# Patient Record
Sex: Female | Born: 1935 | Race: Black or African American | Hispanic: No | Marital: Single | Smoking: Former smoker
Health system: Southern US, Community
[De-identification: ages and names within clinical notes are randomized; demographics above are authoritative.]

## PROBLEM LIST (undated history)

## (undated) DIAGNOSIS — M810 Age-related osteoporosis without current pathological fracture: Secondary | ICD-10-CM

## (undated) DIAGNOSIS — N186 End stage renal disease: Secondary | ICD-10-CM

## (undated) DIAGNOSIS — I10 Essential (primary) hypertension: Secondary | ICD-10-CM

## (undated) DIAGNOSIS — I4891 Unspecified atrial fibrillation: Secondary | ICD-10-CM

---

## 2016-04-17 ENCOUNTER — Other Ambulatory Visit (HOSPITAL_COMMUNITY): Payer: Self-pay

## 2016-04-17 ENCOUNTER — Ambulatory Visit (HOSPITAL_COMMUNITY)
Admission: AD | Admit: 2016-04-17 | Discharge: 2016-04-17 | Disposition: A | Discharge status: Home / Self Care | Payer: Medicare Other | Source: Other Acute Inpatient Hospital | Attending: Internal Medicine | Admitting: Internal Medicine

## 2016-04-17 ENCOUNTER — Inpatient Hospital Stay
Admission: AD | Admit: 2016-04-17 | Discharge: 2016-05-05 | Disposition: E | Payer: Self-pay | Source: Ambulatory Visit | Attending: Internal Medicine | Admitting: Internal Medicine

## 2016-04-17 DIAGNOSIS — J9600 Acute respiratory failure, unspecified whether with hypoxia or hypercapnia: Secondary | ICD-10-CM

## 2016-04-17 DIAGNOSIS — J9690 Respiratory failure, unspecified, unspecified whether with hypoxia or hypercapnia: Secondary | ICD-10-CM

## 2016-04-17 DIAGNOSIS — J9811 Atelectasis: Secondary | ICD-10-CM

## 2016-04-17 DIAGNOSIS — J189 Pneumonia, unspecified organism: Secondary | ICD-10-CM

## 2016-04-17 DIAGNOSIS — Z0189 Encounter for other specified special examinations: Secondary | ICD-10-CM

## 2016-04-17 HISTORY — DX: Age-related osteoporosis without current pathological fracture: M81.0

## 2016-04-17 HISTORY — DX: Unspecified atrial fibrillation: I48.91

## 2016-04-17 HISTORY — DX: Essential (primary) hypertension: I10

## 2016-04-17 HISTORY — DX: End stage renal disease: N18.6

## 2016-04-17 NOTE — Consult Note (Signed)
[facility] CONSULT NOTE    Date: 04/10/2016                  Patient Name:  Natasha Spencer  MRN: [id number]  DOB: [birth year]  Age / Sex: 81 y.o., female         PCP: Pcp Not In System                 Service Requesting Consult: [facility]                 Reason for Consult: Acute renal failure/CKD stage IV            History of Present Illness: Patient is a 81 y.o. female with a PMHx of chronic kidney disease stage IV, hypertension, gout, hyperlipidemia, who was admitted [facility] 04/14/2016 for ongoing medical treatment of small bowel obstruction status post surgical repair. The patient originally [facility] with intractable nausea and vomiting as well as abdominal pain. Initial imaging showed small bowel obstruction as above. She had 4 days of NG tube suction. Despite this her symptoms do not improve. Therefore she went for her expiratory laparotomy and is status post repair. Unfortunately her renal function deteriorated and she was started on hemodialysis. She has a right internal jugular PermCath placed. Previously she had a peritoneal analysis catheter which was never used in preparation for renal replacement therapy. The peritoneal analysis catheter was removed at the time of surgery and apparently was encapsulated. Postoperatively the patient had significant complications. She developed septic shock. She was started on broad-spectrum antibiotics. She also developed SVT requiring electrical cardioversion. She subsequently developed atrial fibrillation as well and she continues to be in atrial fibrillation. Later she had a CT scan of the chest abdomen and pelvis which demonstrated a dilated gallbladder. She was felt to have acalculous cholecystitis and a gallbladder drain was placed. She also had a right-sided thoracentesis performed. Infectious disease also saw the patient.    Medications: Current  medications: Protonix 40 mg daily, Zosyn 2.25 g IV every 8 hours, TPN, lisinopril 20 mg daily, Lipitor 20 mg each bedtime, amlodipine 5 mg daily, allopurinol 100 mg daily, albuterol inhaler 3 mL inhaled every 4 hours, metoprolol 5 mg every 4 hours, metoclopramide 5 mg IV every 8 hours    Allergies: No known drug allergies   Past Medical History: chronic kidney disease stage IV, hypertension, gout, hyperlipidemia,  Past Surgical History: Status post exploratory laparotomy for small bowel obstruction Gallbladder drain placement Right internal jugular PermCath  Family History: Unable to obtain from the patient as she is currently on the ventilator.  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 She has no children. She lived independently prior to becoming ill.   Review of Systems: Unable to obtain from the patient as she is currently on the ventilator.  Vital Signs: Temperature 97.1 pulse 126 respirations 9 blood pressure 134/66 Weight trends: There were no vitals filed for this visit.  Physical Exam: General: Critically ill-appearing  Head: Normocephalic, atraumatic.  Eyes: Mild icterus, extraocular movements intact, bilateral arcus senilis  Nose: Mucous membranes moist, not inflammed, nonerythematous.  Throat: Endotracheal tube in place   Neck: Supple, trachea midline.  Lungs:  Bilateral rhonchi, vent assisted  Heart: S1-S2, irregular, atrial  Fibrillation on the monitor  Abdomen:  Midline abdominal scar, gallbladder drain noted. Scant bowel sounds.  Extremities: No pretibial edema.  Neurologic: Arousable, does nod PS/no to questions  Skin: No visible rashes, scars.    Lab results: Basic Metabolic Panel: No results for input(s): NA, K, CL, CO2, GLUCOSE, BUN, CREATININE, CALCIUM, MG, PHOS in the last 168 hours.  Liver Function Tests: No results for input(s): AST, ALT, ALKPHOS, BILITOT,  PROT, ALBUMIN in the last 168 hours. No results for input(s): LIPASE, AMYLASE in the last 168 hours. No results for input(s): AMMONIA in the last 168 hours.  CBC: No results for input(s): WBC, NEUTROABS, HGB, HCT, MCV, PLT in the last 168 hours.  Cardiac Enzymes: No results for input(s): CKTOTAL, CKMB, CKMBINDEX, TROPONINI in the last 168 hours.  BNP: Invalid input(s): POCBNP  CBG: No results for input(s): GLUCAP in the last 168 hours.  Microbiology: No results found for this or any previous visit.  Coagulation Studies: No results for input(s): LABPROT, INR in the last 72 hours.  Urinalysis: No results for input(s): COLORURINE, LABSPEC, PHURINE, GLUCOSEU, HGBUR, BILIRUBINUR, KETONESUR, PROTEINUR, UROBILINOGEN, NITRITE, LEUKOCYTESUR in the last 72 hours.  Invalid input(s): APPERANCEUR    Imaging:  No results found.   Assessment & Plan: Pt is a 81 y.o. female with a PMHx of chronic kidney disease stage IV, hypertension, gout, hyperlipidemia, who was admitted [facility] on 04-[facility]-16 for ongoing medical treatment of small bowel obstruction status post surgical repair. The patient originally presented to the [facility] with intractable nausea and vomiting as well as abdominal pain. Initial imaging showed small bowel obstruction as above. She had 4 days of NG tube suction. Despite this her symptoms do not improve. Therefore she went for her expiratory laparotomy and is status post repair. Unfortunately her renal function deteriorated and she was started on hemodialysis. She has a right internal jugular PermCath placed. Previously she had a peritoneal analysis catheter which was never used in preparation for renal replacement therapy.  1. Acute renal failure/chronic kidney disease stage IV. We will plan to resume the patient on hemodialysis while she is here at our institution. We will plan for hemodialysis tomorrow. Ultrafiltration target will be set at  1.5 kg. Previously the patient had a peritoneal dialysis catheter in place however this was removed at the time of her recent surgery.  2. Anemia of chronic kidney disease/blood loss. Apparently the patient required  Multiple blood products during her recent hospitalization. We will followup CBC and continue to monitor her hemoglobin. We will also consider use of Aranesp depending upon.  3.  Respiratory failure:  Continue ventilatory support. Weaning protocols will be put into place by pulmonary/medical care.  4. Sepsis. The patient is currently maintained on Zosyn for this issue. This will be continued under the instruction of the hospitalist.

## 2016-04-18 ENCOUNTER — Encounter: Payer: Self-pay | Admitting: Adult Health

## 2016-04-18 ENCOUNTER — Other Ambulatory Visit (HOSPITAL_COMMUNITY): Payer: Self-pay

## 2016-04-18 DIAGNOSIS — N179 Acute kidney failure, unspecified: Secondary | ICD-10-CM

## 2016-04-18 DIAGNOSIS — J9601 Acute respiratory failure with hypoxia: Secondary | ICD-10-CM

## 2016-04-18 DIAGNOSIS — G9340 Encephalopathy, unspecified: Secondary | ICD-10-CM

## 2016-04-18 DIAGNOSIS — E8770 Fluid overload, unspecified: Secondary | ICD-10-CM

## 2016-04-18 LAB — PROTIME-INR
INR: 1.16
INR: 1.26
PROTHROMBIN TIME: 15.9 s — AB (ref 11.4–15.2)
Prothrombin Time: 14.9 seconds (ref 11.4–15.2)

## 2016-04-18 LAB — COMPREHENSIVE METABOLIC PANEL
ALBUMIN: 2.2 g/dL — AB (ref 3.5–5.0)
ALK PHOS: 89 U/L (ref 38–126)
ALT: 115 U/L — AB (ref 14–54)
ANION GAP: 12 (ref 5–15)
AST: 122 U/L — AB (ref 15–41)
BILIRUBIN TOTAL: 17.8 mg/dL — AB (ref 0.3–1.2)
BUN: 40 mg/dL — AB (ref 6–20)
CALCIUM: 8.1 mg/dL — AB (ref 8.9–10.3)
CO2: 22 mmol/L (ref 22–32)
CREATININE: 2.83 mg/dL — AB (ref 0.44–1.00)
Chloride: 102 mmol/L (ref 101–111)
GFR calc Af Amer: 17 mL/min — ABNORMAL LOW (ref >60)
GFR calc non Af Amer: 15 mL/min — ABNORMAL LOW (ref >60)
GLUCOSE: 118 mg/dL — AB (ref 65–99)
Potassium: 4.4 mmol/L (ref 3.5–5.1)
Sodium: 136 mmol/L (ref 135–145)
TOTAL PROTEIN: 5.2 g/dL — AB (ref 6.5–8.1)

## 2016-04-18 LAB — CBC
HCT: 27.4 % — ABNORMAL LOW (ref 36.0–46.0)
HEMATOCRIT: 27.6 % — AB (ref 36.0–46.0)
HEMOGLOBIN: 9.1 g/dL — AB (ref 12.0–15.0)
Hemoglobin: 9 g/dL — ABNORMAL LOW (ref 12.0–15.0)
MCH: 28.6 pg (ref 26.0–34.0)
MCH: 28.8 pg (ref 26.0–34.0)
MCHC: 32.8 g/dL (ref 30.0–36.0)
MCHC: 33 g/dL (ref 30.0–36.0)
MCV: 87 fL (ref 78.0–100.0)
MCV: 87.3 fL (ref 78.0–100.0)
PLATELETS: 68 10*3/uL — AB (ref 150–400)
Platelets: 71 10*3/uL — ABNORMAL LOW (ref 150–400)
RBC: 3.15 MIL/uL — AB (ref 3.87–5.11)
RBC: 3.16 MIL/uL — ABNORMAL LOW (ref 3.87–5.11)
RDW: 20.3 % — AB (ref 11.5–15.5)
RDW: 19.8 % — AB (ref 11.5–15.5)
WBC: 12.7 10*3/uL — ABNORMAL HIGH (ref 4.0–10.5)
WBC: 11.6 10*3/uL — ABNORMAL HIGH (ref 4.0–10.5)

## 2016-04-18 LAB — RENAL FUNCTION PANEL
ANION GAP: 9 (ref 5–15)
Albumin: 1.9 g/dL — ABNORMAL LOW (ref 3.5–5.0)
BUN: 23 mg/dL — AB (ref 6–20)
CHLORIDE: 100 mmol/L — AB (ref 101–111)
CO2: 24 mmol/L (ref 22–32)
Calcium: 7.6 mg/dL — ABNORMAL LOW (ref 8.9–10.3)
Creatinine, Ser: 1.72 mg/dL — ABNORMAL HIGH (ref 0.44–1.00)
GFR calc Af Amer: 31 mL/min — ABNORMAL LOW (ref >60)
GFR calc non Af Amer: 27 mL/min — ABNORMAL LOW (ref >60)
GLUCOSE: 141 mg/dL — AB (ref 65–99)
POTASSIUM: 3.8 mmol/L (ref 3.5–5.1)
Phosphorus: 3.9 mg/dL (ref 2.5–4.6)
Sodium: 133 mmol/L — ABNORMAL LOW (ref 135–145)

## 2016-04-18 LAB — TSH: TSH: 6.461 u[IU]/mL — ABNORMAL HIGH (ref 0.350–4.500)

## 2016-04-18 NOTE — Consult Note (Signed)
Name: Natasha Spencer MRN: [id number] DOB: 1935-07-13    ADMISSION DATE:  05/02/2016 CONSULTATION DATE:  9/14  REFERRING MD :  Sharyon MedicusHijazi   CHIEF COMPLAINT:  Vent management, mucous plugging/collapse   BRIEF PATIENT DESCRIPTION:  81yo female with hx ESRD, HTN initially admitted to [facility] with intractable nausea and vomiting.  Ultimately found to have SBO which failed conservative therapy and underwent exploratory lap which was c/b leakage of abdominal contents into peritoneal cavity.  Post op course was c/b AFib with RVR, septic shock, respiratory failure.  She failed SBT and was ultimately tx [facility] for ongoing [facility] wean.  PCCM consulted for vent management and assistance with mucous plugging/RLL collapse.   SIGNIFICANT EVENTS    STUDIES:     HISTORY OF PRESENT ILLNESS:  81yo female with hx ESRD, HTN initially admitted [facility] with intractable nausea and vomiting.  Ultimately found to have SBO which failed conservative therapy and underwent exploratory lap which was c/b leakage of abdominal contents into peritoneal cavity.  Post op course was c/b AFib with RVR, septic shock, respiratory failure.  She failed SBT and was ultimately [facility] for [facility] wean.  PCCM consulted for vent management and assistance with mucous plugging/RLL collapse.   PAST MEDICAL HISTORY :   has a past medical history of A-fib (HCC); ESRD (end stage renal disease) (HCC); HTN (hypertension); and Osteoporosis.  has no past surgical history on file. Prior to Admission medications   Not on File   No Known Allergies  FAMILY HISTORY:  family history is not on file. SOCIAL HISTORY:  reports that she has quit smoking. She has never used smokeless tobacco.  REVIEW OF SYSTEMS:   As per HPI - All other systems reviewed and were neg.    SUBJECTIVE:   VITAL SIGNS: HR 113 RR 33 BP 118/68 Sats 96% on 40% FiO2   PHYSICAL  EXAMINATION: General:  Frail, chronically ill appearing female, NAD on vent  Neuro:  Awake, alert, nods appropriately, follows commands  HEENT:  Mm moist, ETT  Cardiovascular:  s1s2 rrr, R chest perm cath  Lungs:  resps even non labored on vent, mild tachypnea, course L, diminished R base  Abdomen:  Round, soft, mildly tender diffusely, hypoactive BS  Musculoskeletal:  Warm and dry, scant BLE edema    Recent Labs Lab 04/18/16 0824  NA 136  K 4.4  CL 102  CO2 22  BUN 40*  CREATININE 2.83*  GLUCOSE 118*    Recent Labs Lab 04/18/16 1330  HGB 9.0*  HCT 27.4*  WBC 12.7*  PLT PENDING   Dg Chest Port 1 View  Result Date: 04/18/2016 CLINICAL DATA:  Ventilator support.  Respiratory failure. EXAM: PORTABLE CHEST 1 VIEW COMPARISON:  Earlier same day FINDINGS: Endotracheal tube tip is 4 cm above the carina. Nasogastric tube enters the abdomen. Central line is unchanged. Right lower lobe and middle lobe collapse persists. Partial collapse of the left lower lobe, improved. Upper lungs show interstitial edema which appears worsened. IMPRESSION: Worsening of interstitial edema pattern in the upper lungs. Right lower lobe and middle lobe collapse persists. Better aeration in the left lower lobe. Electronically Signed   By: Paulina FusiMark  Shogry M.D.   On: 04/18/2016 07:55   Dg Chest Port 1 View  Result Date: 04/18/2016 CLINICAL DATA:  Respiratory failure EXAM: PORTABLE CHEST 1 VIEW COMPARISON:  None. FINDINGS: Endotracheal tube tip is 4.6 cm above the carina. Nasogastric tube extends into  the stomach. Dual-lumen right jugular central line extends into the cavoatrial junction. No pneumothorax. There is consolidation in both bases.  Probable pleural effusions. IMPRESSION: Satisfactorily positioned support equipment. Consolidation in both lung bases. Probable effusions. Electronically Signed   By: Ellery Plunk M.D.   On: 04/18/2016 01:44   Dg Abd Portable 1v  Result Date: 04/22/2016 CLINICAL DATA:   NG tube placement. EXAM: PORTABLE ABDOMEN - 1 VIEW COMPARISON:  None. FINDINGS: An enteric tube terminates in the midline of the upper abdomen likely in the distal stomach with side hole well beyond the GE junction. No dilated loops of bowel are seen in the imaged portion of the abdomen. Postoperative changes are noted with midline abdominal skin staples present. Atherosclerotic vascular calcifications are present. A coarse 1.5 cm calcification in the right pelvis may represent a fibroid. Coarse calcification is incompletely visualized over the cardiac shadow in the medial left lung base. IMPRESSION: 1. Enteric tube in the distal stomach. 2. Aortic atherosclerosis. Electronically Signed   By: Sebastian Ache M.D.   On: 04/25/2016 19:48    ASSESSMENT / PLAN:  Acute respiratory failure -- r/t septic shock in setting peritonitis r/t SBO now c/b mucous plugging and RLL collapse.  Mucous plugging/Atelectasis/RLL collapse  ESRD  Peritonitis   PLAN -  -Vent support - 8cc/kg  -F/u CXR  -F/u ABG -Daily SBT  -Agree with aggressive pulmonary hygiene -- mucomyst, chest vest x 24 hrs  -Reassess CXR in am  -Consider FOB if no improvement  -DNR noted  -Poor candidate for trach especially with underlying ESRD  -Would attempt to optimize her current respiratory status with aggressive pulmonary hygiene +/- FOB and volume removal with HD, then can assess for extubation but would recommend no reintubation  -Continue nutritional support with TPN per pharmacy   Dirk Dress, NP 04/18/2016  2:29 PM Pager: [phone] or [phone]  Attending Note:  81 year old female with extensive PMH who presents to [facility] intubated and on dialysis after SBO admission and was taken to the OR with subsequent leak of abdominal contents in the peritoneal cavity and subsequently was sent to [facility] for weaning after failing an SBT.  On exam, diffuse crackles noted with decreased BS on the right.  I reviewed the CXR myself,  evidence of RLL atelectasis noted.  Will maintain on full support for now.  Start mucomyst and vibravest.  Repeat CXR in AM.  Insure patient is at dry weight.  If not resolved in AM then will consider bronch in AM.  Need to discuss with family that once ready for extubation that patient is DNR post extubation.  No trach at this time nor should it be offered given her physical condition and what would be her quality of life then.  PCCM will follow.  The patient is critically ill with multiple organ systems failure and requires high complexity decision making for assessment and support, frequent evaluation and titration of therapies, application of advanced monitoring technologies and extensive interpretation of multiple databases.   Critical Care Time devoted to patient care services described in this note is  35  Minutes. This time reflects time of care of this signee Dr Koren Bound. This critical care time does not reflect procedure time, or teaching time or supervisory time of PA/NP/Med student/Med Resident etc but could involve care discussion time.  Alyson Reedy, M.D. [facility] Pulmonary/Critical Care Medicine. Pager: [phone]. After hours pager: [phone].

## 2016-04-19 ENCOUNTER — Other Ambulatory Visit (HOSPITAL_COMMUNITY): Payer: Self-pay

## 2016-04-19 LAB — RENAL FUNCTION PANEL
ANION GAP: 7 (ref 5–15)
Albumin: 1.7 g/dL — ABNORMAL LOW (ref 3.5–5.0)
BUN: 23 mg/dL — ABNORMAL HIGH (ref 6–20)
CHLORIDE: 100 mmol/L — AB (ref 101–111)
CO2: 27 mmol/L (ref 22–32)
CREATININE: 1.6 mg/dL — AB (ref 0.44–1.00)
Calcium: 7.9 mg/dL — ABNORMAL LOW (ref 8.9–10.3)
GFR, EST AFRICAN AMERICAN: 34 mL/min — AB (ref >60)
GFR, EST NON AFRICAN AMERICAN: 30 mL/min — AB (ref >60)
Glucose, Bld: 130 mg/dL — ABNORMAL HIGH (ref 65–99)
Phosphorus: 3.6 mg/dL (ref 2.5–4.6)
Potassium: 3.9 mmol/L (ref 3.5–5.1)
Sodium: 134 mmol/L — ABNORMAL LOW (ref 135–145)

## 2016-04-19 LAB — CBC
HCT: 28.6 % — ABNORMAL LOW (ref 36.0–46.0)
Hemoglobin: 9.5 g/dL — ABNORMAL LOW (ref 12.0–15.0)
MCH: 29 pg (ref 26.0–34.0)
MCHC: 33.2 g/dL (ref 30.0–36.0)
MCV: 87.2 fL (ref 78.0–100.0)
PLATELETS: 74 10*3/uL — AB (ref 150–400)
RBC: 3.28 MIL/uL — AB (ref 3.87–5.11)
RDW: 20.2 % — ABNORMAL HIGH (ref 11.5–15.5)
WBC: 10.4 10*3/uL (ref 4.0–10.5)

## 2016-04-19 NOTE — Progress Notes (Signed)
[facility]  ROUNDING NOTE   Subjective:  Patient remains critically ill left this point in time. She remains on the ventilator. Discuss with respiratory therapy. Currently she is unable to be weaned from the ventilator secondary to tachypnea. Hospitalist hs requested daily dialysis to aid in ventilator weaning.   Objective:  Vital signs in last 24 hours:    Physical Exam: General: Critically ill-appearing  Head: NG tube in place, ETT in place  Eyes: Mild icterus  Neck: Supple, trachea midline  Lungs:  Bilateral rhonchi, normal effort  Heart: S1S2 irregular  Abdomen:  Midline abdominal scar, gallbladder drain  Extremities: 1+ peripheral edema.  Neurologic: Arousable, will follow simple commands  Skin: No lesions  Access: Right internal jugular PermCath in place    Basic Metabolic Panel:  Recent Labs Lab 04/18/16 0824 04/18/16 2014  NA 136 133*  K 4.4 3.8  CL 102 100*  CO2 22 24  GLUCOSE 118* 141*  BUN 40* 23*  CREATININE 2.83* 1.72*  CALCIUM 8.1* 7.6*  PHOS  --  3.9    Liver Function Tests:  Recent Labs Lab 04/18/16 0824 04/18/16 2014  AST 122*  --   ALT 115*  --   ALKPHOS 89  --   BILITOT 17.8*  --   PROT 5.2*  --   ALBUMIN 2.2* 1.9*   No results for input(s): LIPASE, AMYLASE in the last 168 hours. No results for input(s): AMMONIA in the last 168 hours.  CBC:  Recent Labs Lab 04/18/16 1330 04/18/16 2048  WBC 12.7* 11.6*  HGB 9.0* 9.1*  HCT 27.4* 27.6*  MCV 87.0 87.3  PLT 68* 71*    Cardiac Enzymes: No results for input(s): CKTOTAL, CKMB, CKMBINDEX, TROPONINI in the last 168 hours.  BNP: Invalid input(s): POCBNP  CBG: No results for input(s): GLUCAP in the last 168 hours.  Microbiology: No results found for this or any previous visit.  Coagulation Studies:  Recent Labs  04/18/16 0824 04/18/16 1038  LABPROT 14.9 15.9*  INR 1.16 1.26    Urinalysis: No results for input(s): COLORURINE, LABSPEC, PHURINE,  GLUCOSEU, HGBUR, BILIRUBINUR, KETONESUR, PROTEINUR, UROBILINOGEN, NITRITE, LEUKOCYTESUR in the last 72 hours.  Invalid input(s): APPERANCEUR    Imaging: Dg Chest Port 1 View  Result Date: 04/18/2016 CLINICAL DATA:  Ventilator support.  Respiratory failure. EXAM: PORTABLE CHEST 1 VIEW COMPARISON:  Earlier same day FINDINGS: Endotracheal tube tip is 4 cm above the carina. Nasogastric tube enters the abdomen. Central line is unchanged. Right lower lobe and middle lobe collapse persists. Partial collapse of the left lower lobe, improved. Upper lungs show interstitial edema which appears worsened. IMPRESSION: Worsening of interstitial edema pattern in the upper lungs. Right lower lobe and middle lobe collapse persists. Better aeration in the left lower lobe. Electronically Signed   By: Paulina Fusi M.D.   On: 04/18/2016 07:55   Dg Chest Port 1 View  Result Date: 04/18/2016 CLINICAL DATA:  Respiratory failure EXAM: PORTABLE CHEST 1 VIEW COMPARISON:  None. FINDINGS: Endotracheal tube tip is 4.6 cm above the carina. Nasogastric tube extends into the stomach. Dual-lumen right jugular central line extends into the cavoatrial junction. No pneumothorax. There is consolidation in both bases.  Probable pleural effusions. IMPRESSION: Satisfactorily positioned support equipment. Consolidation in both lung bases. Probable effusions. Electronically Signed   By: Ellery Plunk M.D.   On: 04/18/2016 01:44   Dg Abd Portable 1v  Result Date: 04/18/2016 CLINICAL DATA:  NG tube placement. EXAM: PORTABLE ABDOMEN - 1 VIEW  COMPARISON:  None. FINDINGS: An enteric tube terminates in the midline of the upper abdomen likely in the distal stomach with side hole well beyond the GE junction. No dilated loops of bowel are seen in the imaged portion of the abdomen. Postoperative changes are noted with midline abdominal skin staples present. Atherosclerotic vascular calcifications are present. A coarse 1.5 cm calcification in the  right pelvis may represent a fibroid. Coarse calcification is incompletely visualized over the cardiac shadow in the medial left lung base. IMPRESSION: 1. Enteric tube in the distal stomach. 2. Aortic atherosclerosis. Electronically Signed   By: Sebastian AcheAllen  Grady M.D.   On: April 21, 2016 19:48     Medications:       Assessment/ Plan:  81 y.o. female with a PMHx of chronic kidney disease stage IV, hypertension, gout, hyperlipidemia, who was admitted [facility] on 2016/06[facility]11 for ongoing medical treatment of small bowel obstruction status post surgical repair. The patient [facility] with intractable nausea and vomiting as well as abdominal pain. Initial imaging showed small bowel obstruction as above. She had 4 days of NG tube suction. Despite this her symptoms do not improve. Therefore she went for her expiratory laparotomy and is status post repair. Unfortunately her renal function deteriorated and she was started on hemodialysis. She has a right internal jugular PermCath placed. Previously she had a peritoneal analysis catheter which was never used in preparation for renal replacement therapy.  1. Acute renal failure/chronic kidney disease stage IV. Acute renal failure secondary to sepsis with existing chronic kidney disease stage IV. -Hospitals as requested that we perform daily dialysis for now. We will plan for dialysis today as well as tomorrow and then on Monday.  2. Anemia of chronic kidney disease/blood loss.  Hemoglobin currently 9.1. We will consider use of Aranesp next week.  3.  acute respiratory failure:  Patient is tachypneic per respiratory therapy. There's not been much suctioned at the moment.   LOS: 0 Natasha Spencer 9/15/20178:59 AM

## 2016-04-20 LAB — RENAL FUNCTION PANEL
ALBUMIN: 1.6 g/dL — AB (ref 3.5–5.0)
Anion gap: 12 (ref 5–15)
BUN: 31 mg/dL — ABNORMAL HIGH (ref 6–20)
CALCIUM: 7.8 mg/dL — AB (ref 8.9–10.3)
CO2: 25 mmol/L (ref 22–32)
CREATININE: 2.09 mg/dL — AB (ref 0.44–1.00)
Chloride: 97 mmol/L — ABNORMAL LOW (ref 101–111)
GFR, EST AFRICAN AMERICAN: 25 mL/min — AB (ref >60)
GFR, EST NON AFRICAN AMERICAN: 21 mL/min — AB (ref >60)
Glucose, Bld: 123 mg/dL — ABNORMAL HIGH (ref 65–99)
PHOSPHORUS: 4.1 mg/dL (ref 2.5–4.6)
Potassium: 3.8 mmol/L (ref 3.5–5.1)
SODIUM: 134 mmol/L — AB (ref 135–145)

## 2016-04-20 LAB — PTH, INTACT AND CALCIUM
Calcium, Total (PTH): 7.4 mg/dL — ABNORMAL LOW (ref 8.7–10.3)
PTH: 146 pg/mL — ABNORMAL HIGH (ref 15–65)

## 2016-04-20 LAB — CBC
HCT: 28.3 % — ABNORMAL LOW (ref 36.0–46.0)
Hemoglobin: 9.4 g/dL — ABNORMAL LOW (ref 12.0–15.0)
MCH: 29.5 pg (ref 26.0–34.0)
MCHC: 33.2 g/dL (ref 30.0–36.0)
MCV: 88.7 fL (ref 78.0–100.0)
PLATELETS: 86 10*3/uL — AB (ref 150–400)
RBC: 3.19 MIL/uL — AB (ref 3.87–5.11)
RDW: 20.7 % — ABNORMAL HIGH (ref 11.5–15.5)
WBC: 13.5 10*3/uL — AB (ref 4.0–10.5)

## 2016-04-20 LAB — HEPATITIS B CORE ANTIBODY, TOTAL: HEP B C TOTAL AB: NEGATIVE

## 2016-04-20 LAB — HEPATITIS B SURFACE ANTIGEN: Hepatitis B Surface Ag: NEGATIVE

## 2016-04-20 LAB — HEPATITIS B SURFACE ANTIBODY,QUALITATIVE: HEP B S AB: NONREACTIVE

## 2016-04-21 LAB — CBC
HCT: 29.9 % — ABNORMAL LOW (ref 36.0–46.0)
Hemoglobin: 9.8 g/dL — ABNORMAL LOW (ref 12.0–15.0)
MCH: 28.8 pg (ref 26.0–34.0)
MCHC: 32.8 g/dL (ref 30.0–36.0)
MCV: 87.9 fL (ref 78.0–100.0)
PLATELETS: 106 10*3/uL — AB (ref 150–400)
RBC: 3.4 MIL/uL — AB (ref 3.87–5.11)
RDW: 20.5 % — AB (ref 11.5–15.5)
WBC: 14.1 10*3/uL — AB (ref 4.0–10.5)

## 2016-04-21 LAB — BASIC METABOLIC PANEL
ANION GAP: 7 (ref 5–15)
BUN: 28 mg/dL — AB (ref 6–20)
CO2: 26 mmol/L (ref 22–32)
Calcium: 7.6 mg/dL — ABNORMAL LOW (ref 8.9–10.3)
Chloride: 98 mmol/L — ABNORMAL LOW (ref 101–111)
Creatinine, Ser: 1.82 mg/dL — ABNORMAL HIGH (ref 0.44–1.00)
GFR calc Af Amer: 29 mL/min — ABNORMAL LOW (ref >60)
GFR, EST NON AFRICAN AMERICAN: 25 mL/min — AB (ref >60)
GLUCOSE: 122 mg/dL — AB (ref 65–99)
POTASSIUM: 5.6 mmol/L — AB (ref 3.5–5.1)
SODIUM: 131 mmol/L — AB (ref 135–145)

## 2016-04-22 ENCOUNTER — Other Ambulatory Visit (HOSPITAL_COMMUNITY): Payer: Self-pay

## 2016-04-22 DIAGNOSIS — J9600 Acute respiratory failure, unspecified whether with hypoxia or hypercapnia: Secondary | ICD-10-CM

## 2016-04-22 DIAGNOSIS — Z0189 Encounter for other specified special examinations: Secondary | ICD-10-CM

## 2016-04-22 LAB — CBC
HCT: 27.2 % — ABNORMAL LOW (ref 36.0–46.0)
HEMOGLOBIN: 9 g/dL — AB (ref 12.0–15.0)
MCH: 29.4 pg (ref 26.0–34.0)
MCHC: 33.1 g/dL (ref 30.0–36.0)
MCV: 88.9 fL (ref 78.0–100.0)
PLATELETS: 116 10*3/uL — AB (ref 150–400)
RBC: 3.06 MIL/uL — AB (ref 3.87–5.11)
RDW: 21 % — ABNORMAL HIGH (ref 11.5–15.5)
WBC: 27.6 10*3/uL — ABNORMAL HIGH (ref 4.0–10.5)

## 2016-04-22 LAB — RENAL FUNCTION PANEL
ALBUMIN: 1.3 g/dL — AB (ref 3.5–5.0)
Anion gap: 11 (ref 5–15)
BUN: 40 mg/dL — ABNORMAL HIGH (ref 6–20)
CO2: 24 mmol/L (ref 22–32)
CREATININE: 2.67 mg/dL — AB (ref 0.44–1.00)
Calcium: 7.7 mg/dL — ABNORMAL LOW (ref 8.9–10.3)
Chloride: 99 mmol/L — ABNORMAL LOW (ref 101–111)
GFR calc non Af Amer: 16 mL/min — ABNORMAL LOW (ref >60)
GFR, EST AFRICAN AMERICAN: 18 mL/min — AB (ref >60)
GLUCOSE: 164 mg/dL — AB (ref 65–99)
PHOSPHORUS: 5.1 mg/dL — AB (ref 2.5–4.6)
Potassium: 4.6 mmol/L (ref 3.5–5.1)
SODIUM: 134 mmol/L — AB (ref 135–145)

## 2016-04-22 NOTE — Progress Notes (Signed)
Name: Natasha Spencer MRN: [id number] DOB: 05-Jul-1935    ADMISSION DATE:  05/02/2016 CONSULTATION DATE:  9/14  REFERRING MD :  Sharyon Medicus   CHIEF COMPLAINT:  Vent management, mucous plugging/collapse   BRIEF PATIENT DESCRIPTION:  81yo female with hx ESRD, HTN initially admitted to [facility] with intractable nausea and vomiting.  Ultimately found to have SBO which failed conservative therapy and underwent exploratory lap which was c/b leakage of abdominal contents into peritoneal cavity.  Post op course was c/b AFib with RVR, septic shock, respiratory failure.  She failed SBT and was [facility].  PCCM consulted for vent management and assistance with mucous plugging/RLL collapse.   SIGNIFICANT EVENTS    STUDIES:    SUBJECTIVE:   Currently on HD - not pulling fluid r/t soft BP per HD RN. Less awake.   VITAL SIGNS: HR 93 RR 20 BP 102/49 Sats 93% on 40% FiO2   PHYSICAL EXAMINATION: General:  Frail, chronically ill appearing female, uncomfortable appearing, looks much worse than last week  Neuro:  Less alert today, opens eyes and nods but requires considerable stimulation. Significant deconditioning.  HEENT:  Mm moist, ETT  Cardiovascular:  s1s2 rrr, R chest perm cath  Lungs:  resps even non labored on vent, course L, diminished R base  Abdomen:  Round, soft, mildly tender diffusely, hypoactive BS  Musculoskeletal:  Warm and dry, scant BLE edema    Recent Labs Lab 04/20/16 0623 04/21/16 1004 04/22/16 0500  NA 134* 131* 134*  K 3.8 5.6* 4.6  CL 97* 98* 99*  CO2 25 26 24   BUN 31* 28* 40*  CREATININE 2.09* 1.82* 2.67*  GLUCOSE 123* 122* 164*    Recent Labs Lab 04/20/16 0623 04/21/16 1004 04/22/16 0500  HGB 9.4* 9.8* 9.0*  HCT 28.3* 29.9* 27.2*  WBC 13.5* 14.1* 27.6*  PLT 86* 106* 116*   No results found.  ASSESSMENT / PLAN:  Acute respiratory failure -- r/t septic shock in setting peritonitis r/t SBO  now c/b mucous plugging and RLL collapse.  Mucous plugging/Atelectasis/RLL collapse  ESRD  Peritonitis   PLAN -  - f/u CXR now  -Vent support - 8cc/kg  -F/u ABG -Daily SBT  -continue aggressive pulmonary hygiene -- mucomyst, chest vest x 24 hrs  -Reassess CXR in am  -Consider FOB if no improvement   -DNR noted  -Poor candidate for trach especially with underlying ESRD - would not offer  -Continue nutritional support with TPN per pharmacy   Clinically looks worse overall despite aggressive volume removal and aggressive pulmonary hygiene including mucomyst and vibravest.  Will check CXR now and could potentially consider FOB if indicated.  Otherwise, need to continue goals of care discussions with family and consider transition to comfort.  VERY poor candidate for trach.    Dirk Dress, NP 04/22/2016  10:20 AM Pager: [phone] or [phone]  STAFF NOTE: I, Rory Percy, MD FACP have personally reviewed patient's available data, including medical history, events of note, physical examination and test results as part of my evaluation. I have discussed with resident/NP and other care providers such as pharmacist, RN and RRT. In addition, I personally evaluated patient and elicited key findings of: debilitated, weak, coarse BS but has BS rt base, pcxr now with improved RT base aeration, would dc mucomyst as we have maxed it use at day 4, keep chest pt, take peep to 8 to improve aeration and chances to not collapse, NO  SBT , will collapse immediately off pos pressure settings, no bronch indicated with improvements and futility, likely will need comfort care, I updated brother and istster and brother in law in room The patient is critically ill with multiple organ systems failure and requires high complexity decision making for assessment and support, frequent evaluation and titration of therapies, application of advanced monitoring technologies and extensive interpretation of  multiple databases.   Critical Care Time devoted to patient care services described in this note is 30 Minutes. This time reflects time of care of this signee: Rory Percyaniel Tiant Peixoto, MD FACP. This critical care time does not reflect procedure time, or teaching time or supervisory time of PA/NP/Med student/Med Resident etc but could involve care discussion time. Rest per NP/medical resident whose note is outlined above and that I agree with   Mcarthur Rossettianiel J. Tyson AliasFeinstein, MD, FACP Pgr: [phone] [facility] 04/22/2016 1:23 PM

## 2016-04-22 NOTE — Progress Notes (Signed)
[facility]  ROUNDING NOTE   Subjective:  Patient completed hemodialysis today. Still on the ventilator at this point in time with FiO2 40%. Significant edema persists.   Objective:  Vital signs in last 24 hours:    Physical Exam: General: Critically ill-appearing  Head: NG tube in place, ETT in place  Eyes: Mild icterus  Neck: Supple, trachea midline  Lungs:  Bilateral rhonchi, normal effort  Heart: S1S2 irregular  Abdomen:  Midline abdominal scar, gallbladder drain  Extremities: 2+ peripheral edema.  Neurologic: Arousable, will follow simple commands  Skin: No lesions  Access: Right internal jugular PermCath in place    Basic Metabolic Panel:  Recent Labs Lab 04/18/16 2014  04/19/16 1400 04/20/16 0623 04/21/16 1004 04/22/16 0500  NA 133*  --  134* 134* 131* 134*  K 3.8  --  3.9 3.8 5.6* 4.6  CL 100*  --  100* 97* 98* 99*  CO2 24  --  27 25 26 24   GLUCOSE 141*  --  130* 123* 122* 164*  BUN 23*  --  23* 31* 28* 40*  CREATININE 1.72*  --  1.60* 2.09* 1.82* 2.67*  CALCIUM 7.6*  < > 7.9* 7.8* 7.6* 7.7*  PHOS 3.9  --  3.6 4.1  --  5.1*  < > = values in this interval not displayed.  Liver Function Tests:  Recent Labs Lab 04/18/16 0824 04/18/16 2014 04/19/16 1400 04/20/16 0623 04/22/16 0500  AST 122*  --   --   --   --   ALT 115*  --   --   --   --   ALKPHOS 89  --   --   --   --   BILITOT 17.8*  --   --   --   --   PROT 5.2*  --   --   --   --   ALBUMIN 2.2* 1.9* 1.7* 1.6* 1.3*   No results for input(s): LIPASE, AMYLASE in the last 168 hours. No results for input(s): AMMONIA in the last 168 hours.  CBC:  Recent Labs Lab 04/18/16 2048 04/19/16 1400 04/20/16 0623 04/21/16 1004 04/22/16 0500  WBC 11.6* 10.4 13.5* 14.1* 27.6*  HGB 9.1* 9.5* 9.4* 9.8* 9.0*  HCT 27.6* 28.6* 28.3* 29.9* 27.2*  MCV 87.3 87.2 88.7 87.9 88.9  PLT 71* 74* 86* 106* 116*    Cardiac Enzymes: No results for input(s): CKTOTAL, CKMB, CKMBINDEX, TROPONINI in  the last 168 hours.  BNP: Invalid input(s): POCBNP  CBG: No results for input(s): GLUCAP in the last 168 hours.  Microbiology: No results found for this or any previous visit.  Coagulation Studies: No results for input(s): LABPROT, INR in the last 72 hours.  Urinalysis: No results for input(s): COLORURINE, LABSPEC, PHURINE, GLUCOSEU, HGBUR, BILIRUBINUR, KETONESUR, PROTEINUR, UROBILINOGEN, NITRITE, LEUKOCYTESUR in the last 72 hours.  Invalid input(s): APPERANCEUR    Imaging: Dg Chest Port 1 View  Result Date: 04/22/2016 CLINICAL DATA:  81 year old female status post surgery for small bowel obstruction. Postop septic shock and respiratory failure. Mucous plugging, right lower lobe collapse. Initial encounter. EXAM: PORTABLE CHEST 1 VIEW COMPARISON:  04/19/2016, 04/18/2016. FINDINGS: Portable AP semi upright view at at 1036 hours. Stable endotracheal tube tip position between the clavicles and carina. Enteric tube looped in the left upper quadrant at the level the stomach. Right chest dual lumen dialysis type catheter. Calcified aortic atherosclerosis. Stable cardiac size and mediastinal contours. Improved right lung base ventilation with improved visualization of the right  hemidiaphragm. Residual streaky right perihilar opacity. Probable small bilateral pleural effusions. No pneumothorax or pulmonary edema. IMPRESSION: 1.  Stable lines and tubes. 2. Improved right lung base ventilation with residual right perihilar streaky opacity which may reflect atelectasis. Small bilateral pleural effusions suspected. 3. No new cardiopulmonary abnormality. Electronically Signed   By: Odessa Fleming M.D.   On: 04/22/2016 10:51     Medications:       Assessment/ Plan:  81 y.o. female with a PMHx of chronic kidney disease stage IV, hypertension, gout, hyperlipidemia, who was admitted to [facility] on 29-Apr-2016 for ongoing medical treatment of small bowel obstruction status post surgical repair. The  patient originally presented to the [facility] with intractable nausea and vomiting as well as abdominal pain. Initial imaging showed small bowel obstruction as above. She had 4 days of NG tube suction. Despite this her symptoms do not improve. Therefore she went for her expiratory laparotomy and is status post repair. Unfortunately her renal function deteriorated and she was started on hemodialysis. She has a right internal jugular PermCath placed. Previously she had a peritoneal analysis catheter which was never used in preparation for renal replacement therapy.  1. Acute renal failure/chronic kidney disease stage IV. Acute renal failure secondary to sepsis with existing chronic kidney disease stage IV. -continue to perform daily dialysis for now for volume removal in an effort to help wean the patient from the ventilator.  Volume removal has been difficult given her blood pressure. Continue to use albumin with dialysis.  2. Anemia of chronic kidney disease/blood loss.  Hemoglobin currently 9.0. We will initiate the patient on therapy with Aranesp.  3.  acute respiratory failure:  Patient has been difficult to wean from the ventilator. We will continue ultrafiltration efforts to help her wean from the ventilator.  LOS: 0 Armand Preast 9/18/20174:19 PM

## 2016-04-23 ENCOUNTER — Other Ambulatory Visit (HOSPITAL_COMMUNITY): Payer: Self-pay

## 2016-04-23 LAB — CBC
HCT: 29.6 % — ABNORMAL LOW (ref 36.0–46.0)
Hemoglobin: 9.7 g/dL — ABNORMAL LOW (ref 12.0–15.0)
MCH: 29.2 pg (ref 26.0–34.0)
MCHC: 32.8 g/dL (ref 30.0–36.0)
MCV: 89.2 fL (ref 78.0–100.0)
PLATELETS: DECREASED 10*3/uL (ref 150–400)
RBC: 3.32 MIL/uL — ABNORMAL LOW (ref 3.87–5.11)
RDW: 21.2 % — AB (ref 11.5–15.5)
WBC: 30.1 10*3/uL — ABNORMAL HIGH (ref 4.0–10.5)

## 2016-04-23 LAB — PTH, INTACT AND CALCIUM
Calcium, Total (PTH): 7.1 mg/dL — ABNORMAL LOW (ref 8.7–10.3)
PTH: 204 pg/mL — AB (ref 15–65)

## 2016-04-23 LAB — RENAL FUNCTION PANEL
Albumin: 1.4 g/dL — ABNORMAL LOW (ref 3.5–5.0)
Anion gap: 13 (ref 5–15)
BUN: 42 mg/dL — AB (ref 6–20)
CHLORIDE: 99 mmol/L — AB (ref 101–111)
CO2: 20 mmol/L — AB (ref 22–32)
CREATININE: 2.59 mg/dL — AB (ref 0.44–1.00)
Calcium: 7.8 mg/dL — ABNORMAL LOW (ref 8.9–10.3)
GFR calc Af Amer: 19 mL/min — ABNORMAL LOW (ref >60)
GFR calc non Af Amer: 17 mL/min — ABNORMAL LOW (ref >60)
GLUCOSE: 135 mg/dL — AB (ref 65–99)
Phosphorus: 5.1 mg/dL — ABNORMAL HIGH (ref 2.5–4.6)
Potassium: 5.2 mmol/L — ABNORMAL HIGH (ref 3.5–5.1)
Sodium: 132 mmol/L — ABNORMAL LOW (ref 135–145)

## 2016-04-24 ENCOUNTER — Encounter (HOSPITAL_COMMUNITY): Payer: Self-pay | Admitting: Certified Registered Nurse Anesthetist

## 2016-04-24 LAB — RENAL FUNCTION PANEL
ANION GAP: 10 (ref 5–15)
Albumin: 1.1 g/dL — ABNORMAL LOW (ref 3.5–5.0)
BUN: 58 mg/dL — ABNORMAL HIGH (ref 6–20)
CALCIUM: 7.5 mg/dL — AB (ref 8.9–10.3)
CO2: 24 mmol/L (ref 22–32)
Chloride: 97 mmol/L — ABNORMAL LOW (ref 101–111)
Creatinine, Ser: 3.17 mg/dL — ABNORMAL HIGH (ref 0.44–1.00)
GFR calc non Af Amer: 13 mL/min — ABNORMAL LOW (ref >60)
GFR, EST AFRICAN AMERICAN: 15 mL/min — AB (ref >60)
Glucose, Bld: 120 mg/dL — ABNORMAL HIGH (ref 65–99)
POTASSIUM: 5 mmol/L (ref 3.5–5.1)
Phosphorus: 6 mg/dL — ABNORMAL HIGH (ref 2.5–4.6)
SODIUM: 131 mmol/L — AB (ref 135–145)

## 2016-04-24 LAB — CBC
HEMATOCRIT: 24.5 % — AB (ref 36.0–46.0)
HEMOGLOBIN: 8 g/dL — AB (ref 12.0–15.0)
MCH: 28.6 pg (ref 26.0–34.0)
MCHC: 32.7 g/dL (ref 30.0–36.0)
MCV: 87.5 fL (ref 78.0–100.0)
Platelets: 93 10*3/uL — ABNORMAL LOW (ref 150–400)
RBC: 2.8 MIL/uL — AB (ref 3.87–5.11)
RDW: 20.4 % — ABNORMAL HIGH (ref 11.5–15.5)
WBC: 24.9 10*3/uL — ABNORMAL HIGH (ref 4.0–10.5)

## 2016-04-24 NOTE — Progress Notes (Signed)
[facility]  ROUNDING NOTE   Subjective:  Patient remains on the ventilator at this time. FiO2 50%. Undergoing daily dialysis at this time.   Objective:  Vital signs in last 24 hours:    Physical Exam: General: Critically ill-appearing  Head: NG tube in place, ETT in place  Eyes: Mild icterus  Neck: Supple, trachea midline  Lungs:  Bilateral rhonchi, normal effort  Heart: S1S2 irregular  Abdomen:  Midline abdominal scar, gallbladder drain  Extremities: 2+ peripheral edema.  Neurologic: Arousable, will follow simple commands  Skin: No lesions  Access: Right internal jugular PermCath in place    Basic Metabolic Panel:  Recent Labs Lab 04/19/16 1400 04/20/16 0623 04/21/16 1004 04/22/16 0500 04/23/16 0426 04/24/16 0714  NA 134* 134* 131* 134* 132* 131*  K 3.9 3.8 5.6* 4.6 5.2* 5.0  CL 100* 97* 98* 99* 99* 97*  CO2 27 25 26 24  20* 24  GLUCOSE 130* 123* 122* 164* 135* 120*  BUN 23* 31* 28* 40* 42* 58*  CREATININE 1.60* 2.09* 1.82* 2.67* 2.59* 3.17*  CALCIUM 7.9* 7.8* 7.6* 7.7*  7.1* 7.8* 7.5*  PHOS 3.6 4.1  --  5.1* 5.1* 6.0*    Liver Function Tests:  Recent Labs Lab 04/18/16 0824  04/19/16 1400 04/20/16 0623 04/22/16 0500 04/23/16 0426 04/24/16 0714  AST 122*  --   --   --   --   --   --   ALT 115*  --   --   --   --   --   --   ALKPHOS 89  --   --   --   --   --   --   BILITOT 17.8*  --   --   --   --   --   --   PROT 5.2*  --   --   --   --   --   --   ALBUMIN 2.2*  < > 1.7* 1.6* 1.3* 1.4* 1.1*  < > = values in this interval not displayed. No results for input(s): LIPASE, AMYLASE in the last 168 hours. No results for input(s): AMMONIA in the last 168 hours.  CBC:  Recent Labs Lab 04/20/16 0623 04/21/16 1004 04/22/16 0500 04/23/16 0426 04/24/16 0714  WBC 13.5* 14.1* 27.6* 30.1* 24.9*  HGB 9.4* 9.8* 9.0* 9.7* 8.0*  HCT 28.3* 29.9* 27.2* 29.6* 24.5*  MCV 88.7 87.9 88.9 89.2 87.5  PLT 86* 106* 116* PLATELET CLUMPS NOTED ON  SMEAR, COUNT APPEARS DECREASED 93*    Cardiac Enzymes: No results for input(s): CKTOTAL, CKMB, CKMBINDEX, TROPONINI in the last 168 hours.  BNP: Invalid input(s): POCBNP  CBG: No results for input(s): GLUCAP in the last 168 hours.  Microbiology: No results found for this or any previous visit.  Coagulation Studies: No results for input(s): LABPROT, INR in the last 72 hours.  Urinalysis: No results for input(s): COLORURINE, LABSPEC, PHURINE, GLUCOSEU, HGBUR, BILIRUBINUR, KETONESUR, PROTEINUR, UROBILINOGEN, NITRITE, LEUKOCYTESUR in the last 72 hours.  Invalid input(s): APPERANCEUR    Imaging: Dg Chest Port 1 View  Result Date: 04/23/2016 CLINICAL DATA:  End-stage renal disease known, check endotracheal tube placement EXAM: PORTABLE CHEST 1 VIEW COMPARISON:  04/22/2016 FINDINGS: Cardiac shadow is stable. A right jugular dialysis catheter is noted. Nasogastric catheter is coiled within the stomach. Endotracheal tube is again seen approximately 2 cm above the carina. Aortic calcifications are seen. Changes consistent with small pleural effusions are noted slightly increased on the right when compared with  the prior exam. Diffuse mitral calcifications are noted. No new focal infiltrate is seen no pneumothorax is noted. IMPRESSION: Small bilateral pleural effusions. Tubes and lines as described. Electronically Signed   By: Alcide Clever M.D.   On: 04/23/2016 07:51     Medications:   aranesp 40    Assessment/ Plan:  81 y.o. female with a PMHx of chronic kidney disease stage IV, hypertension, gout, hyperlipidemia, who was admitted to [facility] on 04/22/2016 for ongoing medical treatment of small bowel obstruction status post surgical repair. The patient originally presented to the [facility] with intractable nausea and vomiting as well as abdominal pain. Initial imaging showed small bowel obstruction as above. She had 4 days of NG tube suction. Despite this  her symptoms do not improve. Therefore she went for her expiratory laparotomy and is status post repair. Unfortunately her renal function deteriorated and she was started on hemodialysis. She has a right internal jugular PermCath placed. Previously she had a peritoneal analysis catheter which was never used in preparation for renal replacement therapy.  1. Acute renal failure/chronic kidney disease stage IV. Acute renal failure secondary to sepsis with existing chronic kidney disease stage IV. -It has been requested that we continue to perform daily dialysis in an effort for volume removal which will thereby assist in weaning the patient from the ventilator. Continue to use albumin for blood pressure support.  2. Anemia of chronic kidney disease/blood loss.  Hemoglobin currently 8.0. Continue Aranesp.  3.  acute respiratory failure:  FiO2 up to 50%. Tracheostomy being considered. Continue weaning efforts.  LOS: 0 Rivers Gassmann 9/20/20174:44 PM

## 2016-04-25 ENCOUNTER — Encounter: Payer: Self-pay | Admitting: Certified Registered Nurse Anesthetist

## 2016-04-25 ENCOUNTER — Encounter: Admission: AD | Disposition: E | Payer: Self-pay | Source: Ambulatory Visit | Attending: Internal Medicine

## 2016-04-25 LAB — BLOOD GAS, ARTERIAL
Acid-base deficit: 8.1 mmol/L — ABNORMAL HIGH (ref 0.0–2.0)
Bicarbonate: 16.8 mmol/L — ABNORMAL LOW (ref 20.0–28.0)
FIO2: 100
O2 Saturation: 96.7 %
PATIENT TEMPERATURE: 98.6
PCO2 ART: 32.9 mmHg (ref 32.0–48.0)
PEEP: 5 cmH2O
PH ART: 7.327 — AB (ref 7.350–7.450)
PO2 ART: 164 mmHg — AB (ref 83.0–108.0)
RATE: 20 resp/min
VT: 400 mL

## 2016-04-25 LAB — RENAL FUNCTION PANEL
ALBUMIN: 1.3 g/dL — AB (ref 3.5–5.0)
Anion gap: 17 — ABNORMAL HIGH (ref 5–15)
BUN: 50 mg/dL — AB (ref 6–20)
CHLORIDE: 97 mmol/L — AB (ref 101–111)
CO2: 16 mmol/L — ABNORMAL LOW (ref 22–32)
CREATININE: 2.82 mg/dL — AB (ref 0.44–1.00)
Calcium: 7.2 mg/dL — ABNORMAL LOW (ref 8.9–10.3)
GFR calc Af Amer: 17 mL/min — ABNORMAL LOW (ref >60)
GFR, EST NON AFRICAN AMERICAN: 15 mL/min — AB (ref >60)
GLUCOSE: 104 mg/dL — AB (ref 65–99)
POTASSIUM: 4.8 mmol/L (ref 3.5–5.1)
Phosphorus: 6.5 mg/dL — ABNORMAL HIGH (ref 2.5–4.6)
Sodium: 130 mmol/L — ABNORMAL LOW (ref 135–145)

## 2016-04-25 LAB — CBC
HEMATOCRIT: 20 % — AB (ref 36.0–46.0)
Hemoglobin: 6.7 g/dL — CL (ref 12.0–15.0)
MCH: 29.1 pg (ref 26.0–34.0)
MCHC: 33.5 g/dL (ref 30.0–36.0)
MCV: 87 fL (ref 78.0–100.0)
PLATELETS: 72 10*3/uL — AB (ref 150–400)
RBC: 2.3 MIL/uL — ABNORMAL LOW (ref 3.87–5.11)
RDW: 21.2 % — AB (ref 11.5–15.5)
WBC: 38.6 10*3/uL — AB (ref 4.0–10.5)

## 2016-04-25 LAB — PREPARE RBC (CROSSMATCH)

## 2016-04-25 LAB — ABO/RH: ABO/RH(D): A POS

## 2016-04-25 SURGERY — CREATION, TRACHEOSTOMY
Anesthesia: General

## 2016-04-25 MED ORDER — PROPOFOL 10 MG/ML IV BOLUS
INTRAVENOUS | Status: AC
Start: 2016-04-25 — End: 2016-04-25
  Filled 2016-04-25: qty 20

## 2016-04-25 MED ORDER — MIDAZOLAM HCL 2 MG/2ML IJ SOLN
INTRAMUSCULAR | Status: AC
  Filled 2016-04-25: qty 2

## 2016-04-25 MED ORDER — FENTANYL CITRATE (PF) 100 MCG/2ML IJ SOLN
INTRAMUSCULAR | Status: AC
  Filled 2016-04-25: qty 4

## 2016-04-25 NOTE — Progress Notes (Signed)
Name: Natasha Spencer MRN: [id number] DOB: 1935-07-16    ADMISSION DATE:  08/01/2016 CONSULTATION DATE:  9/14  REFERRING MD :  Sharyon MedicusHijazi   CHIEF COMPLAINT:  Vent management, mucous plugging/collapse   BRIEF PATIENT DESCRIPTION:  81yo female with hx ESRD, HTN [facility] with [facility] and [facility].  Ultimately found to have SBO which failed conservative therapy and underwent exploratory lap which was c/b leakage of abdominal contents into peritoneal cavity.  Post op course was c/b AFib with RVR, septic shock, respiratory failure.  She failed SBT and was ultimately tx to [facility] hospital for ongoing vent wean.  PCCM consulted for vent management and assistance with mucous plugging/RLL collapse.   SIGNIFICANT EVENTS:   STUDIES:    SUBJECTIVE:   Less responsive.  No change overnight per RT.  Family at bedside.  REVIEW OF SYSTEMS:  Unable to obtain given patient's altered mental status and ventilator dependence.  VITAL SIGNS: HR 113 RR 31 BP 102/49 Sats 87% on 60% FiO2 on Vent  PHYSICAL EXAMINATION: General:  Frail, chronically ill appearing female, uncomfortable appearing, looks much worse than Monday  Neuro: minimally responsive, weak cough intermittently  HEENT:  Mm moist, ETT  Cardiovascular:  s1s2 rrr, R chest perm cath  Lungs:  resps even non labored on vent, course L, diminished R base  Abdomen:  Round, soft, mildly tender diffusely, hypoactive BS  Musculoskeletal:  Warm and dry, scant BLE edema    Recent Labs Lab 04/22/16 0500 04/23/16 0426 04/24/16 0714  NA 134* 132* 131*  K 4.6 5.2* 5.0  CL 99* 99* 97*  CO2 24 20* 24  BUN 40* 42* 58*  CREATININE 2.67* 2.59* 3.17*  GLUCOSE 164* 135* 120*    Recent Labs Lab 04/22/16 0500 04/23/16 0426 04/24/16 0714  HGB 9.0* 9.7* 8.0*  HCT 27.2* 29.6* 24.5*  WBC 27.6* 30.1* 24.9*  PLT 116* PLATELET CLUMPS NOTED ON SMEAR, COUNT APPEARS DECREASED 93*   No results  found.  ASSESSMENT / PLAN:  Acute respiratory failure -- r/t septic shock in setting peritonitis r/t SBO now c/b mucous plugging and RLL collapse.  Mucous plugging/Atelectasis/RLL collapse - improved 9/20 ESRD  Peritonitis   Per RT - [facility] has discussed with family who wish to pursue trach, although they do not want long term vent support.  They want to "try the trach and given her a few days to see if it will allow her to wean".    PLAN -  - f/u CXR now  -Vent support - 8cc/kg  -F/u ABG -Daily SBT  -continue aggressive pulmonary hygiene -- mucomyst, chest vest x 24 hrs  -DNR noted  -Continue nutritional support with TPN per pharmacy   Clinically looks worse overall despite aggressive volume removal and aggressive pulmonary hygiene including mucomyst and vibravest.  DO NOT feel that trach is appropriate in this situation.  Highly doubt that she will wean with trach which is an invasive and potentially risky procedure for her where risks FAR outweigh benefits and would highly recommend transition to comfort care.  Tracheostomy is medically ineffective and futile here.   Dirk DressKaty Whiteheart, NP 04/28/2016  9:21 AM Pager: [phone] or [phone]  PCCM Attending Note: Patient seen and examined with nurse practitioner. Please refer to progress note which I have reviewed in detail. No acute events overnight. Continues to be ventilator dependent. Less alert today with eyes closed. Nursing at bedside.  Vitals: Reviewed and as above. General:  No acute  distress. Eyes closed. Family at bedside.  Integument:  Warm & dry. No rash on exposed skin.  HEENT:  Tacky mucus membranes. No scleral icterus. Endotracheal tube in place.  Cardiovascular:  Regular rhythm. Telemetry monitoring currently displaying. No appreciable JVD.  Pulmonary:  Coarse breath sounds bilaterally. Symmetric chest wall rise on ventilator. Abdomen: Soft. Normal bowel sounds. Nondistended.  Musculoskeletal:  No joint  deformity or effusion appreciated. Neurological: Patient not responsive. Pupils symmetric.  A/P:  81 y.o. female with acute hypoxic respiratory failure secondary to peritonitis with small bowel obstruction. Patient continuing on pulmonary toilet. Respiratory status remains marginal. Prognosis is extremely poor. I lengthy discussion with the patient's family at bedside. She is currently planned for a tracheostomy. I explained that I do not expect a tracheostomy to be able to allow her to wean from the ventilator. They expressed that they would not wish for her to be in a long-term care facility or transferred away from family out of state. They are realistic in that the patient's prognosis is poor at it's unlikely that she will recover but wish to try everything possible to give her every opportunity.  1. Acute hypoxic respiratory failure: Continues to require ventilator support. Plan for tracheostomy placement. Recommend continuing aggressive pulmonary hygiene with percussion vest and Mucomyst.  2. Plan of care: Discussed with Dr. Sharyon Medicus and family at bedside. Prognosis is poor for any meaningful recovery. Family are aware that this is highly unlikely. They're also aware that I do not recommend tracheostomy placement.  Donna Christen Jamison Neighbor, M.D. [facility] Pulmonary & Critical Care Pager:  [phone] After 3pm or if no response, call [phone] 4:40 PM 05/15/2016

## 2016-04-25 NOTE — Anesthesia Preprocedure Evaluation (Deleted)
Anesthesia Evaluation  Patient identified by MRN, date of birth, ID band Patient awake    Reviewed: Allergy & Precautions, H&P , NPO status , Patient's Chart, lab work & pertinent test results  History of Anesthesia Complications Negative for: history of anesthetic complications  Airway Mallampati: Intubated       Dental no notable dental hx. (+) Poor Dentition   Pulmonary former smoker,       + intubated    Cardiovascular hypertension, (-) PND Normal cardiovascular exam+ dysrhythmias Atrial Fibrillation  Rhythm:regular Rate:Normal     Neuro/Psych negative neurological ROS     GI/Hepatic negative GI ROS, Neg liver ROS,   Endo/Other  negative endocrine ROS  Renal/GU ESRF and DialysisRenal disease     Musculoskeletal   Abdominal   Peds  Hematology negative hematology ROS (+)   Anesthesia Other Findings  ESRD, HTN that has had SBO with GI complications post op like leakage.  Post op course then with AFib with RVR, septic shock, respiratory failure. Now needs trach.. Is getting daily dialysis  Reproductive/Obstetrics negative OB ROS                            Anesthesia Physical Anesthesia Plan  ASA: IV  Anesthesia Plan: General   Post-op Pain Management:    Induction: Intravenous  Airway Management Planned: Oral ETT  Additional Equipment:   Intra-op Plan:   Post-operative Plan: Post-operative intubation/ventilation  Informed Consent: I have reviewed the patients History and Physical, chart, labs and discussed the procedure including the risks, benefits and alternatives for the proposed anesthesia with the patient or authorized representative who has indicated his/her understanding and acceptance.   Dental Advisory Given  Plan Discussed with: Anesthesiologist, CRNA and Surgeon  Anesthesia Plan Comments:         Anesthesia Quick Evaluation

## 2016-04-26 LAB — TYPE AND SCREEN
ABO/RH(D): A POS
ANTIBODY SCREEN: NEGATIVE
Unit division: 0

## 2016-05-05 DEATH — deceased

## 2017-05-14 IMAGING — CR DG CHEST 1V PORT
1 series · 1 of 1 positions shown · non-contrast
Comparison: Earlier same day

CLINICAL DATA: Ventilator support.  Respiratory failure.

EXAM:
PORTABLE CHEST 1 VIEW

[portable]
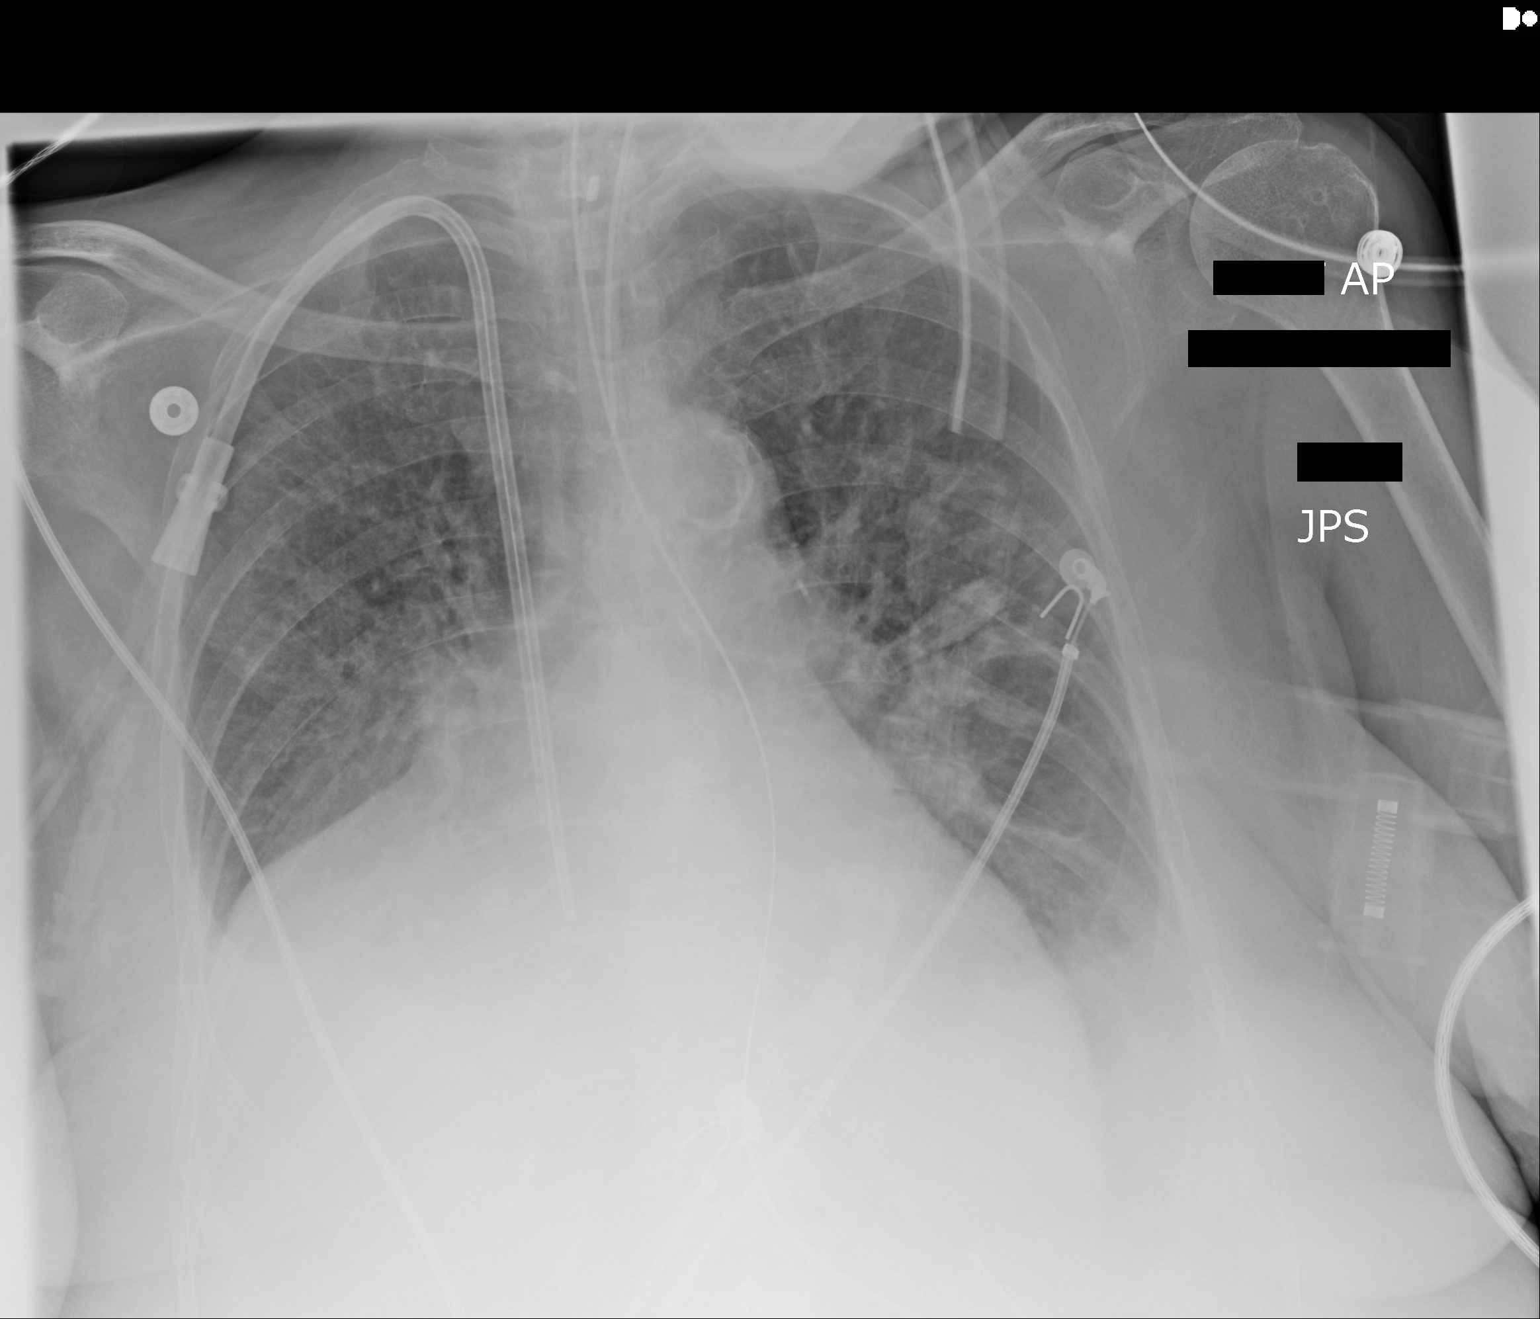

[1 of 1 positions shown; findings below may reference images not displayed]

FINDINGS: Endotracheal tube tip is 4 cm above the carina. Nasogastric tube
enters the abdomen. Central line is unchanged. Right lower lobe and
middle lobe collapse persists. Partial collapse of the left lower
lobe, improved. Upper lungs show interstitial edema which appears
worsened.
IMPRESSION: Worsening of interstitial edema pattern in the upper lungs.

Right lower lobe and middle lobe collapse persists.

Better aeration in the left lower lobe.

## 2017-05-19 IMAGING — DX DG CHEST 1V PORT
1 series · 1 of 1 positions shown · non-contrast
Comparison: 04/22/2016

CLINICAL DATA: End-stage renal disease known, check endotracheal
tube placement

EXAM:
PORTABLE CHEST 1 VIEW

[chest ap]
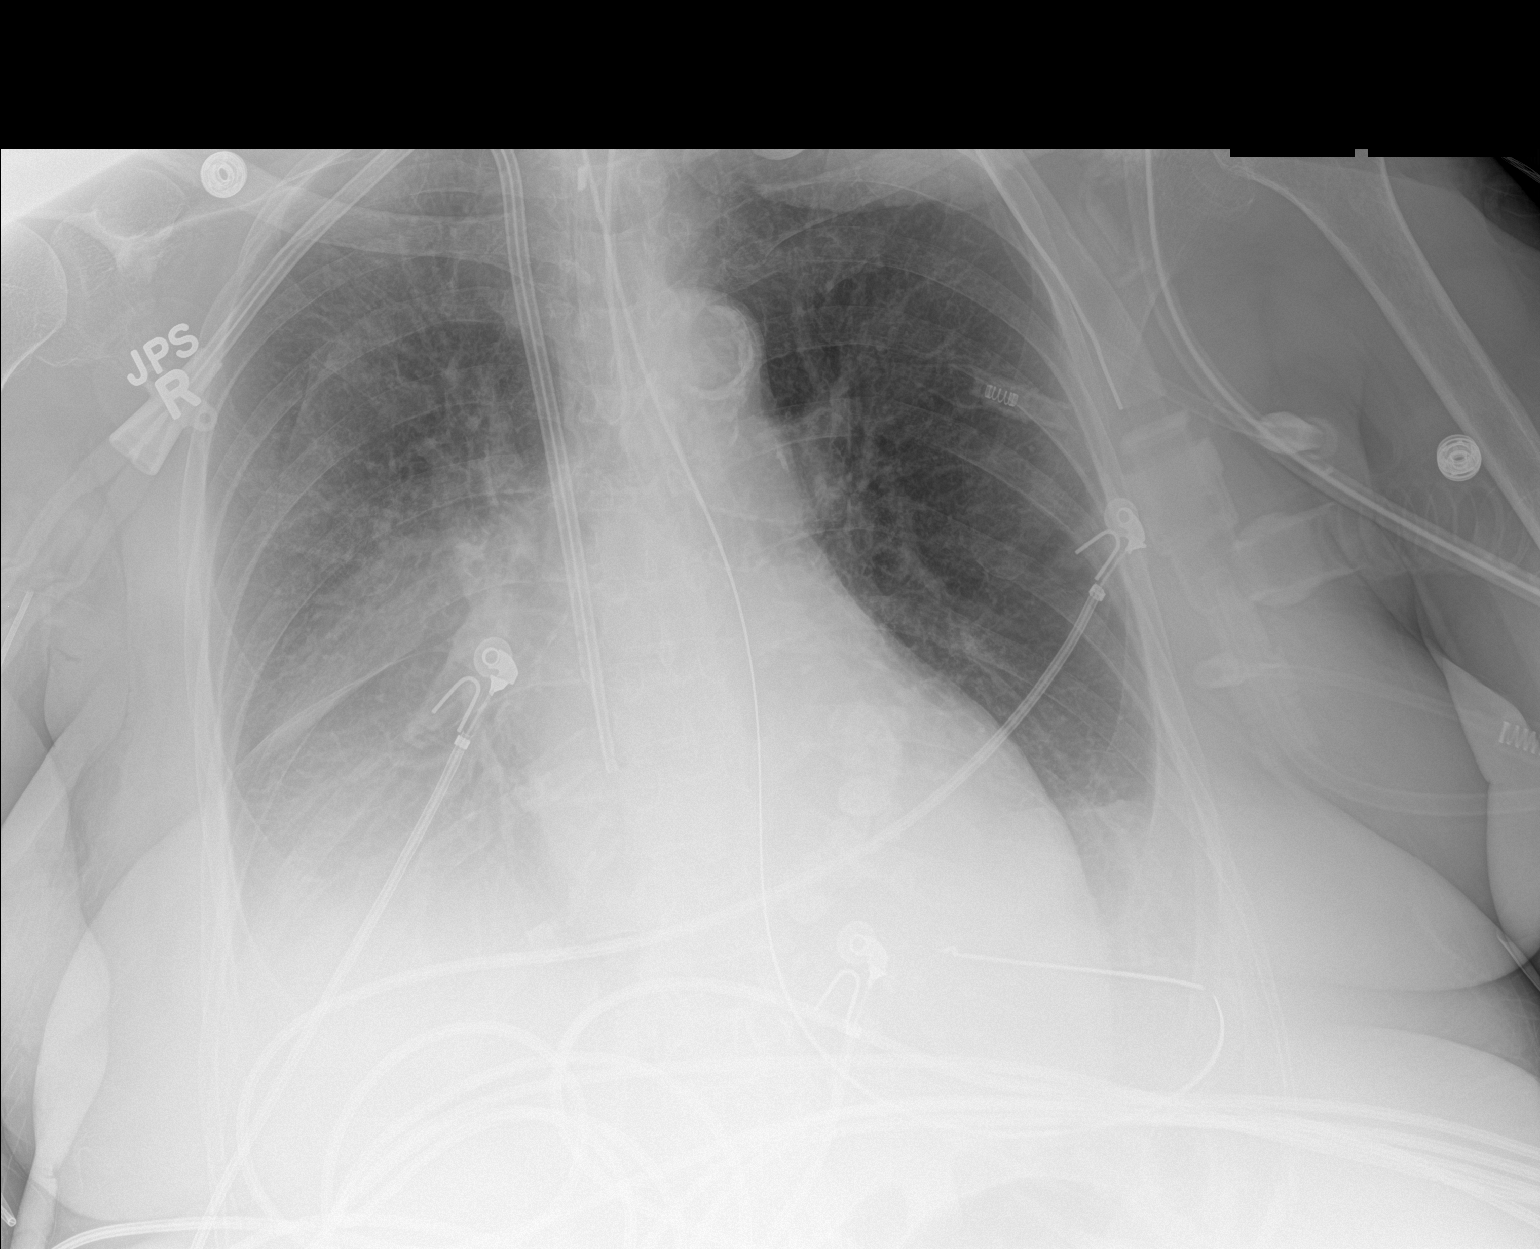

[1 of 1 positions shown; findings below may reference images not displayed]

FINDINGS: Cardiac shadow is stable. A right jugular dialysis catheter is
noted. Nasogastric catheter is coiled within the stomach.
Endotracheal tube is again seen approximately 2 cm above the carina.
Aortic calcifications are seen. Changes consistent with small
pleural effusions are noted slightly increased on the right when
compared with the prior exam. Diffuse mitral calcifications are
noted. No new focal infiltrate is seen no pneumothorax is noted.
IMPRESSION: Small bilateral pleural effusions.

Tubes and lines as described.
